# Patient Record
Sex: Female | Born: 1965 | Race: Black or African American | Hispanic: No | Marital: Married | Smoking: Never smoker
Health system: Southern US, Community
[De-identification: ages and names within clinical notes are randomized; demographics above are authoritative.]

## PROBLEM LIST (undated history)

## (undated) ENCOUNTER — Ambulatory Visit: Admission: EM

---

## 2020-02-18 ENCOUNTER — Other Ambulatory Visit: Payer: Self-pay

## 2020-02-18 ENCOUNTER — Encounter: Payer: Self-pay | Admitting: Emergency Medicine

## 2020-02-18 ENCOUNTER — Emergency Department
Admission: EM | Admit: 2020-02-18 | Discharge: 2020-02-18 | Disposition: A | Discharge status: Home / Self Care | Attending: Student in an Organized Health Care Education/Training Program | Admitting: Student in an Organized Health Care Education/Training Program

## 2020-02-18 DIAGNOSIS — M7989 Other specified soft tissue disorders: Secondary | ICD-10-CM | POA: Diagnosis not present

## 2020-02-18 DIAGNOSIS — M79604 Pain in right leg: Secondary | ICD-10-CM | POA: Diagnosis not present

## 2020-02-18 DIAGNOSIS — W010XXA Fall on same level from slipping, tripping and stumbling without subsequent striking against object, initial encounter: Secondary | ICD-10-CM | POA: Diagnosis not present

## 2020-02-18 DIAGNOSIS — W19XXXA Unspecified fall, initial encounter: Secondary | ICD-10-CM

## 2020-02-18 MED ORDER — CYCLOBENZAPRINE HCL 5 MG PO TABS: ORAL_TABLET | ORAL | 0 refills | Status: DC

## 2020-02-18 MED ORDER — KETOROLAC TROMETHAMINE 30 MG/ML IJ SOLN
30.0000 mg | Freq: Once | INTRAMUSCULAR | Status: AC
  Administered 2020-02-18: 30 mg via INTRAMUSCULAR
  Filled 2020-02-18: qty 1

## 2020-02-18 MED ORDER — KETOROLAC TROMETHAMINE 10 MG PO TABS: 10.0000 mg | ORAL_TABLET | Freq: Four times a day (QID) | ORAL | 0 refills | Status: DC | PRN

## 2020-02-18 NOTE — ED Triage Notes (Signed)
Tripped and fell today.  C/O low back, left leg, and left ankle pain.

## 2020-02-18 NOTE — ED Provider Notes (Signed)
[facility] Emergency Department Provider Note  ____________________________________________  Time seen: Approximately 10:20 AM  I have reviewed the triage vital signs and the nursing notes.   HISTORY  Chief Complaint Fall    HPI Jillian Hancock is a 54 y.o. female that presents to the emergency department for evaluation after a fall yesterday. Patient tripped over a cord on her back porch in the dark last night. She landed on her right side and her right leg twisted. She was able to get herself up and walk into the house.  She feels tight and sore to her low back, left hip, left leg, left ankle.  She feels that it is just tight and that nothing is broken.  She also has a little bit of swelling to her left ring finger but is moving it normally. She did not hit her head or lose consciousness. She took Aleve last night but had difficulty sleeping due to the discomfort.  History reviewed. No pertinent past medical history.  There are no problems to display for this patient.   History reviewed. No pertinent surgical history.  Prior to Admission medications   Medication Sig Start Date End Date Taking? Authorizing Provider  cyclobenzaprine (FLEXERIL) 5 MG tablet Take 1-2 tablets 3 times daily as needed 02/18/20   Enid Derry, PA-C  ketorolac (TORADOL) 10 MG tablet Take 1 tablet (10 mg total) by mouth every 6 (six) hours as needed. 02/18/20   Enid Derry, PA-C    Allergies Patient has no known allergies.  No family history on file.  Social History Social History   Tobacco Use  . Smoking status: Never Smoker  . Smokeless tobacco: Never Used  Substance Use Topics  . Alcohol use: Not on file  . Drug use: Not on file     Review of Systems  Cardiovascular: No chest pain. Respiratory: No SOB. Gastrointestinal: No abdominal pain.  No nausea, no vomiting.  Musculoskeletal: Positive for finger, back, hip, leg, ankle pain. Skin: Negative for rash,  abrasions, lacerations, ecchymosis. Neurological: Negative for headaches   ____________________________________________   PHYSICAL EXAM:  VITAL SIGNS: ED Triage Vitals  Enc Vitals Group     BP 02/18/20 0924 (!) 148/80     Pulse Rate 02/18/20 0924 74     Resp 02/18/20 0924 16     Temp 02/18/20 0924 97.8 F (36.6 C)     Temp Source 02/18/20 0924 Oral     SpO2 02/18/20 0924 98 %     Weight 02/18/20 0922 175 lb (79.4 kg)     Height 02/18/20 0922 5\' 4"  (1.626 m)     Head Circumference --      Peak Flow --      Pain Score 02/18/20 0922 6     Pain Loc --      Pain Edu? --      Excl. in GC? --      Constitutional: Alert and oriented. Well appearing and in no acute distress. Eyes: Conjunctivae are normal. PERRL. EOMI. Head: Atraumatic. ENT:      Ears:      Nose: No congestion/rhinnorhea.      Mouth/Throat: Mucous membranes are moist.  Neck: No stridor. Cardiovascular: Normal rate, regular rhythm.  Good peripheral circulation.  Symmetric pedal pulses. Respiratory: Normal respiratory effort without tachypnea or retractions. Lungs CTAB. Good air entry to the bases with no decreased or absent breath sounds. Musculoskeletal: Full range of motion to all extremities. No gross deformities appreciated. Mild diffuse tenderness  to palpation to left lumbar paraspinal muscles and left hip.  No tenderness palpation over lumbar spine.  Full ROM of left hip. No swelling or ecchymosis to left ankle.  Mild tenderness to left lateral malleolus.  Full ROM of left ankle.  Antalgic gait. Neurologic:  Normal speech and language. No gross focal neurologic deficits are appreciated.  Skin:  Skin is warm, dry and intact. No rash noted. Psychiatric: Mood and affect are normal. Speech and behavior are normal. Patient exhibits appropriate insight and judgement.   ____________________________________________   LABS (all labs ordered are listed, but only abnormal results are displayed)  Labs Reviewed - No  data to display ____________________________________________  EKG   ____________________________________________  RADIOLOGY   No results found.  ____________________________________________    PROCEDURES  Procedure(s) performed:    Procedures    Medications  ketorolac (TORADOL) 30 MG/ML injection 30 mg (30 mg Intramuscular Given 02/18/20 1040)     ____________________________________________   INITIAL IMPRESSION / ASSESSMENT AND PLAN / ED COURSE  Pertinent labs & imaging results that were available during my care of the patient were reviewed by me and considered in my medical decision making (see chart for details).  Review of the [place] CSRS was performed in accordance of the [facility] prior to dispensing any controlled drugs.   Patient presented to emergency department for evaluation after fall yesterday.  Vital signs and exam are reassuring.  Patient declines imaging at this time, which I agree with.  She was given IM Toradol for pain.  Patient will be discharged home with prescriptions for Flexeril and Toradol. Patient is to follow up with primary care as directed. Patient is given ED precautions to return to the ED for any worsening or new symptoms.  Jillian Hancock was evaluated in Emergency Department on 02/18/2020 for the symptoms described in the history of present illness. She was evaluated in the context of the global COVID-19 pandemic, which necessitated consideration that the patient might be at risk for infection with the SARS-CoV-2 virus that causes COVID-19. Institutional protocols and algorithms that pertain to the evaluation of patients at risk for COVID-19 are in a state of rapid change based on information released by regulatory bodies including the CDC and federal and state organizations. These policies and algorithms were followed during the patient's care in the ED.   ____________________________________________  FINAL CLINICAL IMPRESSION(S) / ED  DIAGNOSES  Final diagnoses:  Fall, initial encounter      NEW MEDICATIONS STARTED DURING THIS VISIT:  ED Discharge Orders         Ordered    cyclobenzaprine (FLEXERIL) 5 MG tablet        02/18/20 1057    ketorolac (TORADOL) 10 MG tablet  Every 6 hours PRN        02/18/20 1057              This chart was dictated using voice recognition software/Dragon. Despite best efforts to proofread, errors can occur which can change the meaning. Any change was purely unintentional.    Enid Derry, PA-C 02/18/20 1349    Willy Eddy, MD 02/18/20 1356

## 2020-02-28 ENCOUNTER — Other Ambulatory Visit: Payer: Self-pay

## 2020-02-28 ENCOUNTER — Emergency Department
Admission: EM | Admit: 2020-02-28 | Discharge: 2020-02-28 | Disposition: A | Discharge status: Home / Self Care | Attending: Emergency Medicine | Admitting: Emergency Medicine

## 2020-02-28 ENCOUNTER — Emergency Department

## 2020-02-28 DIAGNOSIS — W19XXXA Unspecified fall, initial encounter: Secondary | ICD-10-CM | POA: Insufficient documentation

## 2020-02-28 DIAGNOSIS — S66515A Strain of intrinsic muscle, fascia and tendon of left ring finger at wrist and hand level, initial encounter: Secondary | ICD-10-CM | POA: Insufficient documentation

## 2020-02-28 DIAGNOSIS — H00014 Hordeolum externum left upper eyelid: Secondary | ICD-10-CM | POA: Diagnosis not present

## 2020-02-28 DIAGNOSIS — S60945A Unspecified superficial injury of left ring finger, initial encounter: Secondary | ICD-10-CM | POA: Diagnosis present

## 2020-02-28 DIAGNOSIS — S63615A Unspecified sprain of left ring finger, initial encounter: Secondary | ICD-10-CM

## 2020-02-28 DIAGNOSIS — M545 Low back pain, unspecified: Secondary | ICD-10-CM | POA: Insufficient documentation

## 2020-02-28 DIAGNOSIS — S93602A Unspecified sprain of left foot, initial encounter: Secondary | ICD-10-CM

## 2020-02-28 DIAGNOSIS — S96912A Strain of unspecified muscle and tendon at ankle and foot level, left foot, initial encounter: Secondary | ICD-10-CM | POA: Diagnosis not present

## 2020-02-28 MED ORDER — TOBRAMYCIN 0.3 % OP SOLN: 2.0000 [drp] | OPHTHALMIC | 0 refills | Status: AC

## 2020-02-28 MED ORDER — CYCLOBENZAPRINE HCL 5 MG PO TABS: ORAL_TABLET | ORAL | 0 refills | Status: AC

## 2020-02-28 MED ORDER — MELOXICAM 15 MG PO TABS: 15.0000 mg | ORAL_TABLET | Freq: Every day | ORAL | 2 refills | Status: AC

## 2020-02-28 NOTE — Discharge Instructions (Signed)
Follow-up with your regular doctor as needed.  Follow-up orthopedics if not improving.  Phone number is attached. Follow-up with an eye specialist if your stye does not clear up with antibiotic drops and warm wet compresses Return to emergency department worsening

## 2020-02-28 NOTE — ED Provider Notes (Signed)
[facility] Emergency Department Provider Note  ____________________________________________   First MD Initiated Contact with Patient 02/28/20 1123     (approximate)  I have reviewed the triage vital signs and the nursing notes.   HISTORY  Chief Complaint Leg Pain and Hand Pain    HPI Jory Welke is a 54 y.o. female presents emergency department after a fall last week.  Patient was seen here on 10/15 and refused imaging at that time.  She continues to have swelling in the left hand and bruising along the left foot.  Is now having back pain and leg pain due to using the crutches.  States she did not pick up her prescription and she does not know what pharmacy they went to.  She did use of her husband's muscle relaxers without any relief.  No new injury. also c/o sty on left upper lid, warm compresses not helping   History reviewed. No pertinent past medical history.  There are no problems to display for this patient.   History reviewed. No pertinent surgical history.  Prior to Admission medications   Medication Sig Start Date End Date Taking? Authorizing Provider  cyclobenzaprine (FLEXERIL) 5 MG tablet Take 1-2 tablets 3 times daily as needed 02/28/20   Sherrie Mustache Roselyn Bering, PA-C  meloxicam (MOBIC) 15 MG tablet Take 1 tablet (15 mg total) by mouth daily. 02/28/20 02/27/21  Queenie Aufiero, Roselyn Bering, PA-C  tobramycin (TOBREX) 0.3 % ophthalmic solution Place 2 drops into both eyes every 4 (four) hours. 02/28/20   Faythe Ghee, PA-C    Allergies Patient has no known allergies.  No family history on file.  Social History Social History   Tobacco Use  . Smoking status: Never Smoker  . Smokeless tobacco: Never Used  Substance Use Topics  . Alcohol use: Not on file  . Drug use: Not on file    Review of Systems  Constitutional: No fever/chills Eyes: No visual changes. ENT: No sore throat. Respiratory: Denies cough Cardiovascular: Denies chest  pain Gastrointestinal: Denies abdominal pain Genitourinary: Negative for dysuria. Musculoskeletal: Positive for back pain, left foot, left leg, and left hand pain. Skin: Negative for rash. Psychiatric: no mood changes,     ____________________________________________   PHYSICAL EXAM:  VITAL SIGNS: ED Triage Vitals  Enc Vitals Group     BP 02/28/20 1046 124/62     Pulse Rate 02/28/20 1046 85     Resp 02/28/20 1046 18     Temp 02/28/20 1046 97.7 F (36.5 C)     Temp Source 02/28/20 1046 Oral     SpO2 02/28/20 1046 96 %     Weight 02/28/20 1012 175 lb (79.4 kg)     Height 02/28/20 1012 5\' 4"  (1.626 m)     Head Circumference --      Peak Flow --      Pain Score 02/28/20 1128 7     Pain Loc --      Pain Edu? --      Excl. in GC? --     Constitutional: Alert and oriented. Well appearing and in no acute distress. Eyes: Conjunctivae are normal. Large sty noted on left upper inner lid Head: Atraumatic. Nose: No congestion/rhinnorhea. Mouth/Throat: Mucous membranes are moist.   Neck:  supple no lymphadenopathy noted Cardiovascular: Normal rate, regular rhythm. Heart sounds are normal Respiratory: Normal respiratory effort.  No retractions, lungs c t a  GU: deferred Musculoskeletal: FROM all extremities, warm and well perfused, bruising noted along the  lateral aspect of the left foot, area is tender to palpation, tib-fib is not tender, knee is nontender, the musculature of the thigh is slightly tender, lumbar spine is not tender but the musculature are spasmed, left hand does show swelling along the left fourth and fifth fingers.  Neurovascular is intact Neurologic:  Normal speech and language.  Skin:  Skin is warm, dry and intact. No rash noted. Psychiatric: Mood and affect are normal. Speech and behavior are normal.  ____________________________________________   LABS (all labs ordered are listed, but only abnormal results are displayed)  Labs Reviewed - No data to  display ____________________________________________   ____________________________________________  RADIOLOGY  X-ray of the left foot and left hand  ____________________________________________   PROCEDURES  Procedure(s) performed: No  Procedures    ____________________________________________   INITIAL IMPRESSION / ASSESSMENT AND PLAN / ED COURSE  Pertinent labs & imaging results that were available during my care of the patient were reviewed by me and considered in my medical decision making (see chart for details).   Patient is a 54 year old female presents after fall 2010/15.  See HPI.  Physical exam shows a left hand does have some swelling along fingers and the left foot have bruising and tenderness.  Do feel that patient should be x-rayed for these.  Feel that other complaints are mostly due to muscle spasms in her using crutches.  X-ray of the left hand and left foot are both negative, reviewed by me; explained findings to the patient, will use wooden shoe, crutches if necessary, elevate and ice, resent rx for flexeril and mobic, return if worsening, f/u orthopedics if not improving in 1 week     Felesha Moncrieffe was evaluated in Emergency Department on 02/28/2020 for the symptoms described in the history of present illness. She was evaluated in the context of the global COVID-19 pandemic, which necessitated consideration that the patient might be at risk for infection with the SARS-CoV-2 virus that causes COVID-19. Institutional protocols and algorithms that pertain to the evaluation of patients at risk for COVID-19 are in a state of rapid change based on information released by regulatory bodies including the CDC and federal and state organizations. These policies and algorithms were followed during the patient's care in the ED.    As part of my medical decision making, I reviewed the following data within the electronic MEDICAL RECORD NUMBER Nursing notes reviewed and  incorporated, Old chart reviewed, Radiograph reviewed , Notes from prior ED visits and [place] Controlled Substance Database  ____________________________________________   FINAL CLINICAL IMPRESSION(S) / ED DIAGNOSES  Final diagnoses:  Sprain of left ring finger, unspecified site of digit, initial encounter  Sprain of left foot, initial encounter  Hordeolum externum of left upper eyelid      NEW MEDICATIONS STARTED DURING THIS VISIT:  Discharge Medication List as of 02/28/2020 12:58 PM    START taking these medications   Details  meloxicam (MOBIC) 15 MG tablet Take 1 tablet (15 mg total) by mouth daily., Starting Mon 02/28/2020, Until Tue 02/27/2021, Normal    tobramycin (TOBREX) 0.3 % ophthalmic solution Place 2 drops into both eyes every 4 (four) hours., Starting Mon 02/28/2020, Normal         Note:  This document was prepared using Dragon voice recognition software and may include unintentional dictation errors.    Faythe Ghee, PA-C 02/28/20 1341    Dionne Bucy, MD 02/28/20 1525

## 2020-02-28 NOTE — ED Triage Notes (Signed)
Pt was seen here on 10/15 for a fall and has returned today with c/o continued left leg and finger pain. Pt arrives with crutches

## 2020-03-27 ENCOUNTER — Emergency Department
Admission: EM | Admit: 2020-03-27 | Discharge: 2020-03-28 | Disposition: A | Discharge status: Home / Self Care | Attending: Emergency Medicine | Admitting: Emergency Medicine

## 2020-03-27 ENCOUNTER — Encounter: Payer: Self-pay | Admitting: *Deleted

## 2020-03-27 ENCOUNTER — Other Ambulatory Visit: Payer: Self-pay

## 2020-03-27 DIAGNOSIS — Z5321 Procedure and treatment not carried out due to patient leaving prior to being seen by health care provider: Secondary | ICD-10-CM | POA: Diagnosis not present

## 2020-03-27 DIAGNOSIS — M545 Low back pain, unspecified: Secondary | ICD-10-CM | POA: Diagnosis not present

## 2020-03-27 DIAGNOSIS — R1030 Lower abdominal pain, unspecified: Secondary | ICD-10-CM | POA: Diagnosis present

## 2020-03-27 DIAGNOSIS — R110 Nausea: Secondary | ICD-10-CM | POA: Diagnosis not present

## 2020-03-27 DIAGNOSIS — R300 Dysuria: Secondary | ICD-10-CM | POA: Diagnosis not present

## 2020-03-27 LAB — COMPREHENSIVE METABOLIC PANEL
ALT: 16 U/L (ref 0–44)
AST: 17 U/L (ref 15–41)
Albumin: 3.8 g/dL (ref 3.5–5.0)
Alkaline Phosphatase: 74 U/L (ref 38–126)
Anion gap: 4 — ABNORMAL LOW (ref 5–15)
BUN: 25 mg/dL — ABNORMAL HIGH (ref 6–20)
CO2: 25 mmol/L (ref 22–32)
Calcium: 9.8 mg/dL (ref 8.9–10.3)
Chloride: 111 mmol/L (ref 98–111)
Creatinine, Ser: 1.38 mg/dL — ABNORMAL HIGH (ref 0.44–1.00)
GFR, Estimated: 46 mL/min — ABNORMAL LOW (ref >60)
Glucose, Bld: 118 mg/dL — ABNORMAL HIGH (ref 70–99)
Potassium: 4.2 mmol/L (ref 3.5–5.1)
Sodium: 140 mmol/L (ref 135–145)
Total Bilirubin: 0.3 mg/dL (ref 0.3–1.2)
Total Protein: 7.2 g/dL (ref 6.5–8.1)

## 2020-03-27 LAB — CBC
HCT: 37.1 % (ref 36.0–46.0)
Hemoglobin: 12.5 g/dL (ref 12.0–15.0)
MCH: 33.6 pg (ref 26.0–34.0)
MCHC: 33.7 g/dL (ref 30.0–36.0)
MCV: 99.7 fL (ref 80.0–100.0)
Platelets: 343 10*3/uL (ref 150–400)
RBC: 3.72 MIL/uL — ABNORMAL LOW (ref 3.87–5.11)
RDW: 12 % (ref 11.5–15.5)
WBC: 11.2 10*3/uL — ABNORMAL HIGH (ref 4.0–10.5)
nRBC: 0 % (ref 0.0–0.2)

## 2020-03-27 LAB — URINALYSIS, COMPLETE (UACMP) WITH MICROSCOPIC
Bilirubin Urine: NEGATIVE
Glucose, UA: NEGATIVE mg/dL
Ketones, ur: NEGATIVE mg/dL
Nitrite: NEGATIVE
Protein, ur: NEGATIVE mg/dL
RBC / HPF: >50 RBC/hpf — ABNORMAL HIGH (ref 0–5)
Specific Gravity, Urine: 1.024 (ref 1.005–1.030)
pH: 5 (ref 5.0–8.0)

## 2020-03-27 LAB — LIPASE, BLOOD: Lipase: 20 U/L (ref 11–51)

## 2020-03-27 NOTE — ED Triage Notes (Signed)
Pt to triage via wheelchair.  Pt has low abd pain with nausea.  Sx began today.  Pt has dysuria.  No vag bleeding.   Pt has lower back pain.  Pt alert.

## 2021-09-12 IMAGING — DX DG FOOT COMPLETE 3+V*L*
3 series · 3 of 3 positions shown · non-contrast
Comparison: None.

CLINICAL DATA: Left foot pain and swelling after fall

EXAM:
LEFT FOOT - COMPLETE 3+ VIEW

[foot ap]
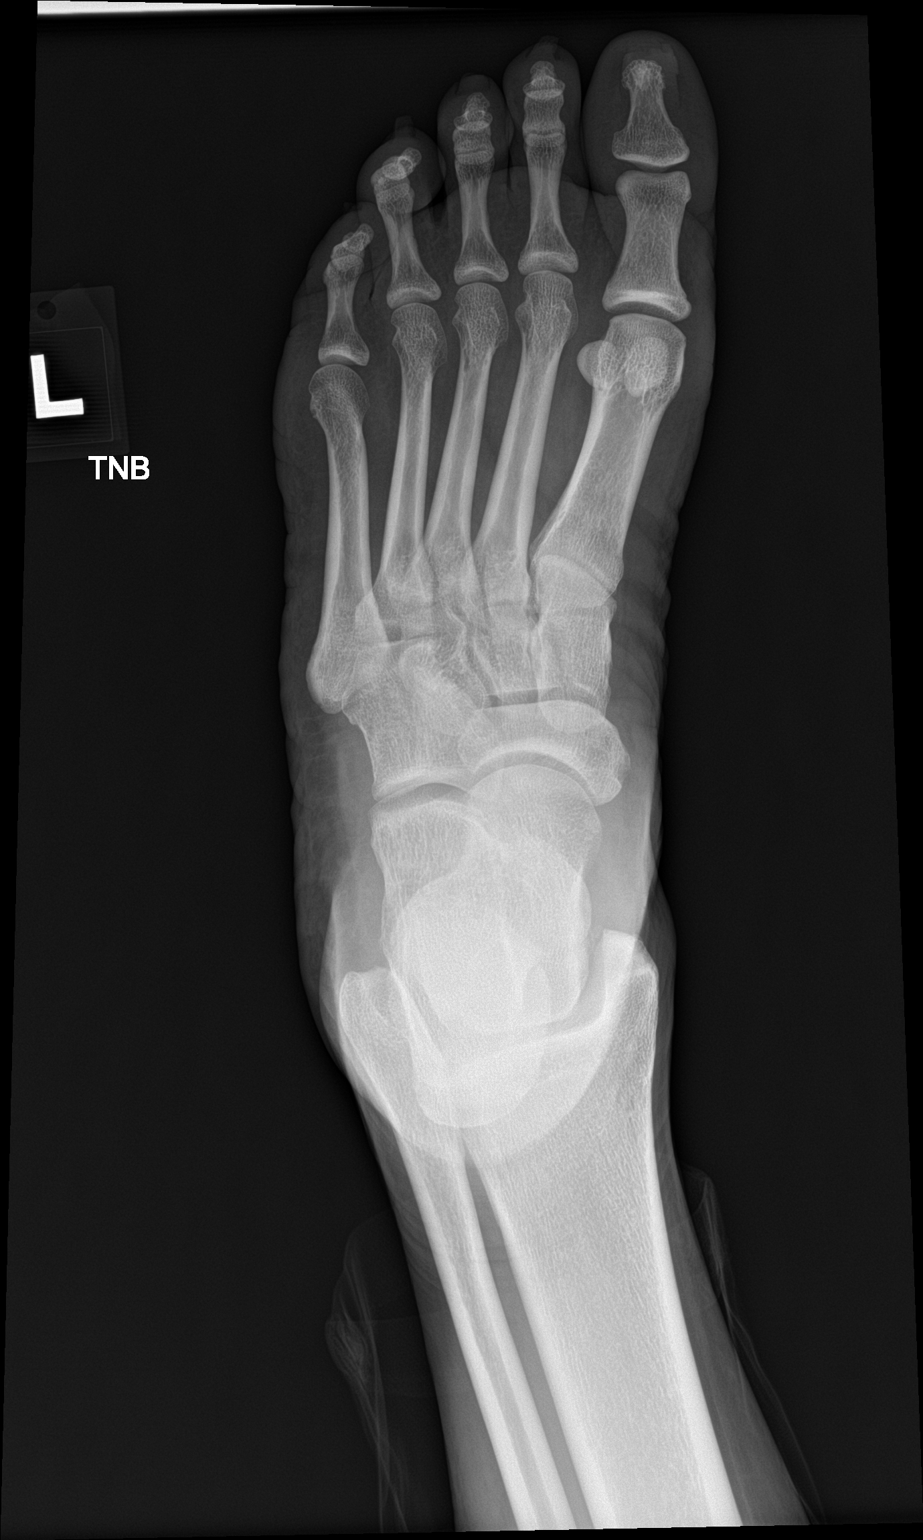

[foot obl]
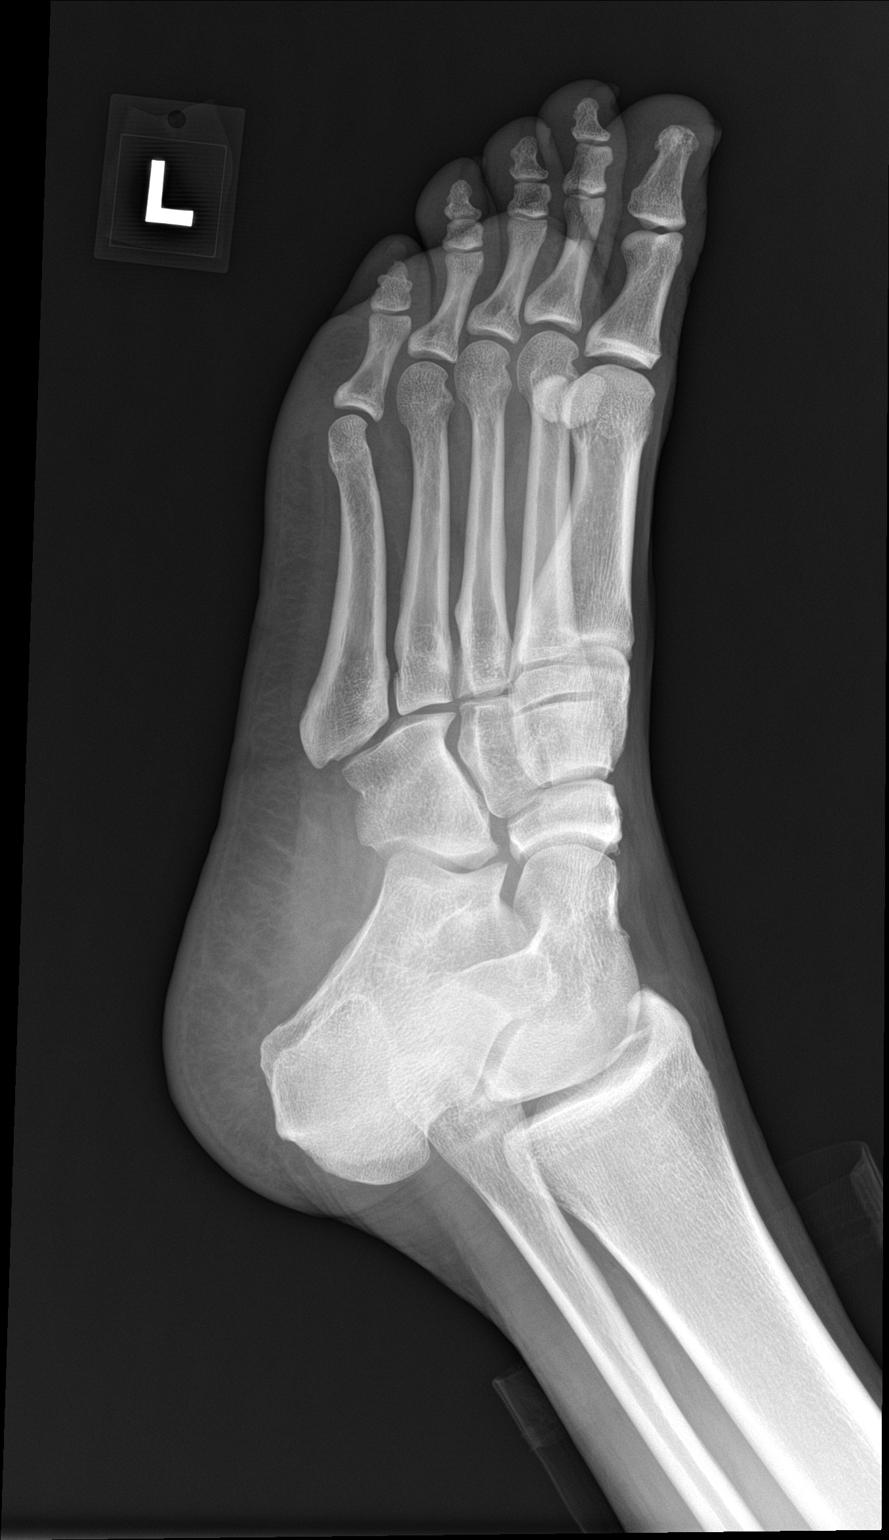

[foot lat]
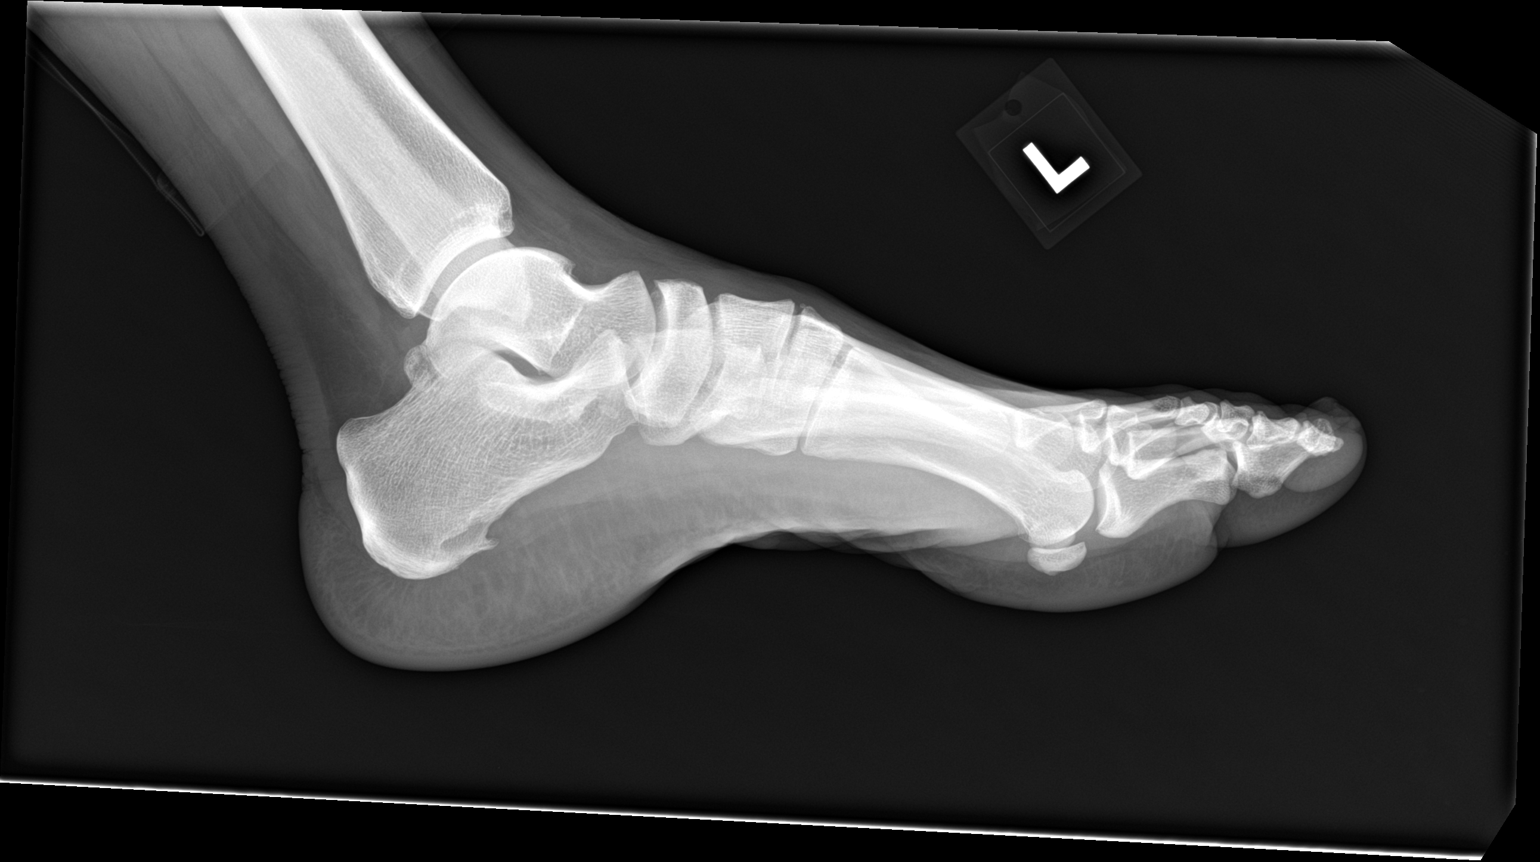

[3 of 3 positions shown; findings below may reference images not displayed]

FINDINGS: There is no evidence of fracture or dislocation. There is no
evidence of arthropathy or other focal bone abnormality. Small
plantar calcaneal spur. Soft tissues are unremarkable.
IMPRESSION: Negative.
